# Patient Record
Sex: Female | Born: 1960 | Race: White | Hispanic: No | Smoking: Never smoker
Health system: Southern US, Community
[De-identification: ages and names within clinical notes are randomized; demographics above are authoritative.]

## PROBLEM LIST (undated history)

## (undated) DIAGNOSIS — A498 Other bacterial infections of unspecified site: Secondary | ICD-10-CM

## (undated) DIAGNOSIS — K5790 Diverticulosis of intestine, part unspecified, without perforation or abscess without bleeding: Secondary | ICD-10-CM

## (undated) DIAGNOSIS — K529 Noninfective gastroenteritis and colitis, unspecified: Secondary | ICD-10-CM

## (undated) DIAGNOSIS — I10 Essential (primary) hypertension: Secondary | ICD-10-CM

## (undated) HISTORY — PX: ABDOMINAL HYSTERECTOMY: SHX81

## (undated) HISTORY — DX: Other bacterial infections of unspecified site: A49.8

---

## 2006-11-10 ENCOUNTER — Emergency Department: Payer: Self-pay | Admitting: Internal Medicine

## 2011-12-19 ENCOUNTER — Emergency Department (HOSPITAL_BASED_OUTPATIENT_CLINIC_OR_DEPARTMENT_OTHER)
Admission: EM | Admit: 2011-12-19 | Discharge: 2011-12-19 | Disposition: A | Discharge status: Home / Self Care | Payer: BC Managed Care – PPO | Attending: Emergency Medicine | Admitting: Emergency Medicine

## 2011-12-19 ENCOUNTER — Encounter (HOSPITAL_BASED_OUTPATIENT_CLINIC_OR_DEPARTMENT_OTHER): Payer: Self-pay | Admitting: *Deleted

## 2011-12-19 DIAGNOSIS — R109 Unspecified abdominal pain: Secondary | ICD-10-CM

## 2011-12-19 DIAGNOSIS — I10 Essential (primary) hypertension: Secondary | ICD-10-CM | POA: Insufficient documentation

## 2011-12-19 DIAGNOSIS — R197 Diarrhea, unspecified: Secondary | ICD-10-CM | POA: Insufficient documentation

## 2011-12-19 HISTORY — DX: Essential (primary) hypertension: I10

## 2011-12-19 LAB — URINALYSIS, ROUTINE W REFLEX MICROSCOPIC
Hgb urine dipstick: NEGATIVE
Nitrite: NEGATIVE
Protein, ur: NEGATIVE mg/dL
Specific Gravity, Urine: 1.027 (ref 1.005–1.030)
Urobilinogen, UA: 0.2 mg/dL (ref 0.0–1.0)

## 2011-12-19 MED ORDER — DICYCLOMINE HCL 20 MG PO TABS: 20.0000 mg | ORAL_TABLET | Freq: Two times a day (BID) | ORAL | Status: DC

## 2011-12-19 MED ORDER — SODIUM CHLORIDE 0.9 % IV SOLN: Freq: Once | INTRAVENOUS | Status: DC

## 2011-12-19 MED ORDER — OXYCODONE-ACETAMINOPHEN 5-325 MG PO TABS
2.0000 | ORAL_TABLET | Freq: Once | ORAL | Status: AC
  Administered 2011-12-19: 2 via ORAL
  Filled 2011-12-19: qty 2

## 2011-12-19 NOTE — ED Provider Notes (Signed)
History     CSN: [id number]  Arrival date & time 12/19/11  1611   First MD Initiated Contact with Patient 12/19/11 1649      Chief Complaint  Patient presents with  . Abdominal Pain    (Consider location/radiation/quality/duration/timing/severity/associated sxs/prior treatment) Patient is a 51 y.o. female presenting with abdominal pain. The history is provided by the patient. No language interpreter was used.  Abdominal Pain The primary symptoms of the illness include abdominal pain and diarrhea. The primary symptoms of the illness do not include nausea or vomiting. The onset of the illness was gradual. The problem has been rapidly worsening.  The patient states that she believes she is currently not pregnant. Significant associated medical issues include inflammatory bowel disease.  Pt reports she has been diagnosed with colitis.  Pt has seen Dr. Norma Fredrickson and Dr. Alberteen Sam.   Pt is not currently on any medications.   Pt reports she has had diarrhea today and abdominal cramping  Past Medical History  Diagnosis Date  . Hypertension     Past Surgical History  Procedure Date  . Abdominal hysterectomy     History reviewed. No pertinent family history.  History  Substance Use Topics  . Smoking status: Never Smoker   . Smokeless tobacco: Not on file  . Alcohol Use: No    OB History    Grav Para Term Preterm Abortions TAB SAB Ect Mult Living                  Review of Systems  Gastrointestinal: Positive for abdominal pain and diarrhea. Negative for nausea and vomiting.  All other systems reviewed and are negative.    Allergies  Review of patient's allergies indicates no known allergies.  Home Medications   Current Outpatient Rx  Name Route Sig Dispense Refill  . PROPRANOLOL HCL 20 MG PO TABS Oral Take 20 mg by mouth 3 (three) times daily.      BP 146/95  Pulse 95  Temp(Src) 99.1 F (37.3 C) (Oral)  Resp 16  Ht 5\' 5"  (1.651 m)  Wt 166 lb (75.297 kg)  BMI 27.62  kg/m2  SpO2 100%  Physical Exam  Nursing note and vitals reviewed. Constitutional: She is oriented to person, place, and time. She appears well-developed and well-nourished.  HENT:  Head: Normocephalic and atraumatic.  Right Ear: External ear normal.  Left Ear: External ear normal.  Nose: Nose normal.  Mouth/Throat: Oropharynx is clear and moist.  Eyes: Conjunctivae and EOM are normal. Pupils are equal, round, and reactive to light.  Neck: Normal range of motion. Neck supple.  Cardiovascular: Normal rate, regular rhythm and normal heart sounds.   Pulmonary/Chest: Effort normal and breath sounds normal.  Abdominal: Soft.  Musculoskeletal: Normal range of motion.  Neurological: She is alert and oriented to person, place, and time. She has normal reflexes.  Skin: Skin is warm.  Psychiatric: She has a normal mood and affect.    ED Course  Procedures (including critical care time)   Labs Reviewed  URINALYSIS, ROUTINE W REFLEX MICROSCOPIC   No results found.   No diagnosis found.    MDM   Pt declines xrays or labs.  Pt request medication for cramps.    Pt reports she is scheduled to see her MD and have labs.     Dr. Anitra Lauth in to see and examine.  Pt given rx for bentyl      Lonia Skinner [place], [place] 12/19/11 1739

## 2011-12-19 NOTE — Discharge Instructions (Signed)

## 2011-12-19 NOTE — ED Notes (Signed)
Pt refused IV fluids and labs.

## 2011-12-19 NOTE — ED Provider Notes (Signed)
Medical screening examination/treatment/procedure(s) were conducted as a shared visit with non-physician practitioner(s) and myself.  I personally evaluated the patient during the encounter Patient with known colitis and intermittent abdominal pain that is usually worse with bowel movements. The pain that she really bad today and it did not resolve which led her to coming here. She has lab work scheduled for tomorrow and a colonoscopy in the near future. Her abdomen is soft and there are no peritoneal signs. She is refusing any imaging or lab tests and just wants some medicine for pain. Feel this is reasonable as patient has close followup  Gwyneth Sprout, MD 12/19/11 [phone]

## 2011-12-19 NOTE — ED Notes (Signed)
Pt c/o mid abd pain with loose stools x 1 day HX colitis

## 2011-12-19 NOTE — ED Notes (Signed)
Pt states does not want IV or lab work done. Will have lab work done by PMD. Lavonda Jumbo PA notified.

## 2012-06-01 ENCOUNTER — Ambulatory Visit (HOSPITAL_BASED_OUTPATIENT_CLINIC_OR_DEPARTMENT_OTHER)
Admission: RE | Admit: 2012-06-01 | Discharge: 2012-06-01 | Disposition: A | Discharge status: Home / Self Care | Payer: BC Managed Care – PPO | Source: Ambulatory Visit | Attending: Family Medicine | Admitting: Family Medicine

## 2012-06-01 ENCOUNTER — Other Ambulatory Visit (HOSPITAL_BASED_OUTPATIENT_CLINIC_OR_DEPARTMENT_OTHER): Payer: Self-pay | Admitting: Family Medicine

## 2012-06-01 DIAGNOSIS — Z1231 Encounter for screening mammogram for malignant neoplasm of breast: Secondary | ICD-10-CM | POA: Insufficient documentation

## 2013-06-26 ENCOUNTER — Emergency Department (HOSPITAL_BASED_OUTPATIENT_CLINIC_OR_DEPARTMENT_OTHER)
Admission: EM | Admit: 2013-06-26 | Discharge: 2013-06-26 | Disposition: A | Discharge status: Home / Self Care | Payer: BC Managed Care – PPO | Attending: Emergency Medicine | Admitting: Emergency Medicine

## 2013-06-26 ENCOUNTER — Encounter (HOSPITAL_BASED_OUTPATIENT_CLINIC_OR_DEPARTMENT_OTHER): Payer: Self-pay | Admitting: Emergency Medicine

## 2013-06-26 ENCOUNTER — Emergency Department (HOSPITAL_BASED_OUTPATIENT_CLINIC_OR_DEPARTMENT_OTHER): Payer: BC Managed Care – PPO

## 2013-06-26 DIAGNOSIS — Z79899 Other long term (current) drug therapy: Secondary | ICD-10-CM | POA: Insufficient documentation

## 2013-06-26 DIAGNOSIS — K5730 Diverticulosis of large intestine without perforation or abscess without bleeding: Secondary | ICD-10-CM | POA: Insufficient documentation

## 2013-06-26 DIAGNOSIS — I10 Essential (primary) hypertension: Secondary | ICD-10-CM | POA: Insufficient documentation

## 2013-06-26 DIAGNOSIS — Z90710 Acquired absence of both cervix and uterus: Secondary | ICD-10-CM | POA: Insufficient documentation

## 2013-06-26 DIAGNOSIS — Z792 Long term (current) use of antibiotics: Secondary | ICD-10-CM | POA: Insufficient documentation

## 2013-06-26 DIAGNOSIS — R109 Unspecified abdominal pain: Secondary | ICD-10-CM

## 2013-06-26 HISTORY — DX: Noninfective gastroenteritis and colitis, unspecified: K52.9

## 2013-06-26 HISTORY — DX: Diverticulosis of intestine, part unspecified, without perforation or abscess without bleeding: K57.90

## 2013-06-26 LAB — CBC WITH DIFFERENTIAL/PLATELET
Basophils Absolute: 0 10*3/uL (ref 0.0–0.1)
Basophils Relative: 0 % (ref 0–1)
Eosinophils Relative: 1 % (ref 0–5)
HCT: 42.2 % (ref 36.0–46.0)
MCHC: 33.4 g/dL (ref 30.0–36.0)
MCV: 90.6 fL (ref 78.0–100.0)
Monocytes Absolute: 1.2 10*3/uL — ABNORMAL HIGH (ref 0.1–1.0)
Neutro Abs: 10.4 10*3/uL — ABNORMAL HIGH (ref 1.7–7.7)
Platelets: 269 10*3/uL (ref 150–400)
RDW: 15 % (ref 11.5–15.5)
WBC: 13.4 10*3/uL — ABNORMAL HIGH (ref 4.0–10.5)

## 2013-06-26 LAB — COMPREHENSIVE METABOLIC PANEL
ALT: 18 U/L (ref 0–35)
AST: 19 U/L (ref 0–37)
Albumin: 4 g/dL (ref 3.5–5.2)
Calcium: 9.4 mg/dL (ref 8.4–10.5)
Creatinine, Ser: 0.7 mg/dL (ref 0.50–1.10)
Sodium: 138 mEq/L (ref 135–145)

## 2013-06-26 LAB — URINALYSIS, ROUTINE W REFLEX MICROSCOPIC
Glucose, UA: NEGATIVE mg/dL
Ketones, ur: 15 mg/dL — AB
Leukocytes, UA: NEGATIVE
Protein, ur: NEGATIVE mg/dL
Urobilinogen, UA: 0.2 mg/dL (ref 0.0–1.0)

## 2013-06-26 MED ORDER — METRONIDAZOLE 500 MG PO TABS: 500.0000 mg | ORAL_TABLET | Freq: Two times a day (BID) | ORAL | Status: DC

## 2013-06-26 MED ORDER — TRAMADOL HCL 50 MG PO TABS: 50.0000 mg | ORAL_TABLET | Freq: Four times a day (QID) | ORAL | Status: DC | PRN

## 2013-06-26 MED ORDER — CIPROFLOXACIN HCL 500 MG PO TABS: 500.0000 mg | ORAL_TABLET | Freq: Two times a day (BID) | ORAL | Status: DC

## 2013-06-26 NOTE — ED Provider Notes (Signed)
CSN: [id number]     Arrival date & time 06/26/13  1041 History   First MD Initiated Contact with Patient 06/26/13 1105     Chief Complaint  Patient presents with  . Dysuria   (Consider location/radiation/quality/duration/timing/severity/associated sxs/prior Treatment) HPI Comments: Patient presents with lower abdominal pain. She does have a history of diverticulosis and colitis and states she had similar pain currently. She has pain to her lower abdomen. Constant for the last few days. She's had a little bit of urinary symptoms with burning on urination. She denies any back pain. She denies any nausea vomiting or diarrhea. She's not had a bowel movement last 2-3 days. She denies he fevers or chills. She denies any vaginal bleeding or discharge.  Patient is a 52 y.o. female presenting with dysuria.  Dysuria Associated symptoms: abdominal pain   Associated symptoms: no fever, no flank pain, no nausea and no vomiting     Past Medical History  Diagnosis Date  . Hypertension   . Diverticulosis   . Colitis    Past Surgical History  Procedure Laterality Date  . Abdominal hysterectomy     No family history on file. History  Substance Use Topics  . Smoking status: Never Smoker   . Smokeless tobacco: Not on file  . Alcohol Use: No   OB History   Grav Para Term Preterm Abortions TAB SAB Ect Mult Living                 Review of Systems  Constitutional: Negative for fever, chills, diaphoresis and fatigue.  HENT: Negative for congestion, rhinorrhea and sneezing.   Eyes: Negative.   Respiratory: Negative for cough, chest tightness and shortness of breath.   Cardiovascular: Negative for chest pain and leg swelling.  Gastrointestinal: Positive for abdominal pain. Negative for nausea, vomiting, diarrhea and blood in stool.  Genitourinary: Positive for dysuria. Negative for frequency, hematuria, flank pain and difficulty urinating.  Musculoskeletal: Negative for arthralgias and back pain.   Skin: Negative for rash.  Neurological: Negative for dizziness, speech difficulty, weakness, numbness and headaches.    Allergies  Review of patient's allergies indicates no known allergies.  Home Medications   Current Outpatient Rx  Name  Route  Sig  Dispense  Refill  . ciprofloxacin (CIPRO) 500 MG tablet   Oral   Take 1 tablet (500 mg total) by mouth 2 (two) times daily. One po bid x 7 days   14 tablet   0   . metroNIDAZOLE (FLAGYL) 500 MG tablet   Oral   Take 1 tablet (500 mg total) by mouth 2 (two) times daily. One po bid x 7 days   14 tablet   0   . propranolol (INDERAL) 20 MG tablet   Oral   Take 20 mg by mouth 3 (three) times daily.         . traMADol (ULTRAM) 50 MG tablet   Oral   Take 1 tablet (50 mg total) by mouth every 6 (six) hours as needed.   15 tablet   0    BP 148/98  Pulse 105  Temp(Src) 98.9 F (37.2 C)  Resp 16  Ht 5\' 5"  (1.651 m)  Wt 165 lb (74.844 kg)  BMI 27.46 kg/m2  SpO2 98% Physical Exam  Constitutional: She is oriented to person, place, and time. She appears well-developed and well-nourished.  HENT:  Head: Normocephalic and atraumatic.  Eyes: Pupils are equal, round, and reactive to light.  Neck: Normal range of motion.  Neck supple.  Cardiovascular: Normal rate, regular rhythm and normal heart sounds.   Pulmonary/Chest: Effort normal and breath sounds normal. No respiratory distress. She has no wheezes. She has no rales. She exhibits no tenderness.  Abdominal: Soft. Bowel sounds are normal. There is tenderness (mild tenderness across the lower abdomen and in the left midabdomen). There is no rebound and no guarding.  Musculoskeletal: Normal range of motion. She exhibits no edema.  Lymphadenopathy:    She has no cervical adenopathy.  Neurological: She is alert and oriented to person, place, and time.  Skin: Skin is warm and dry. No rash noted.  Psychiatric: She has a normal mood and affect.    ED Course  Procedures (including  critical care time) Labs Review Results for orders placed during the hospital encounter of 06/26/13  URINALYSIS, ROUTINE W REFLEX MICROSCOPIC      Result Value Range   Color, Urine YELLOW  YELLOW   APPearance CLEAR  CLEAR   Specific Gravity, Urine 1.023  1.005 - 1.030   pH 6.5  5.0 - 8.0   Glucose, UA NEGATIVE  NEGATIVE mg/dL   Hgb urine dipstick NEGATIVE  NEGATIVE   Bilirubin Urine NEGATIVE  NEGATIVE   Ketones, ur 15 (*) NEGATIVE mg/dL   Protein, ur NEGATIVE  NEGATIVE mg/dL   Urobilinogen, UA 0.2  0.0 - 1.0 mg/dL   Nitrite NEGATIVE  NEGATIVE   Leukocytes, UA NEGATIVE  NEGATIVE  CBC WITH DIFFERENTIAL      Result Value Range   WBC 13.4 (*) 4.0 - 10.5 K/uL   RBC 4.66  3.87 - 5.11 MIL/uL   Hemoglobin 14.1  12.0 - 15.0 g/dL   HCT 21.3  08.6 - 57.8 %   MCV 90.6  78.0 - 100.0 fL   MCH 30.3  26.0 - 34.0 pg   MCHC 33.4  30.0 - 36.0 g/dL   RDW 46.9  62.9 - 52.8 %   Platelets 269  150 - 400 K/uL   Neutrophils Relative % 78 (*) 43 - 77 %   Neutro Abs 10.4 (*) 1.7 - 7.7 K/uL   Lymphocytes Relative 12  12 - 46 %   Lymphs Abs 1.6  0.7 - 4.0 K/uL   Monocytes Relative 9  3 - 12 %   Monocytes Absolute 1.2 (*) 0.1 - 1.0 K/uL   Eosinophils Relative 1  0 - 5 %   Eosinophils Absolute 0.2  0.0 - 0.7 K/uL   Basophils Relative 0  0 - 1 %   Basophils Absolute 0.0  0.0 - 0.1 K/uL  COMPREHENSIVE METABOLIC PANEL      Result Value Range   Sodium 138  135 - 145 mEq/L   Potassium 4.0  3.5 - 5.1 mEq/L   Chloride 101  96 - 112 mEq/L   CO2 25  19 - 32 mEq/L   Glucose, Bld 118 (*) 70 - 99 mg/dL   BUN 13  6 - 23 mg/dL   Creatinine, Ser 4.13  0.50 - 1.10 mg/dL   Calcium 9.4  8.4 - 24.4 mg/dL   Total Protein 7.5  6.0 - 8.3 g/dL   Albumin 4.0  3.5 - 5.2 g/dL   AST 19  0 - 37 U/L   ALT 18  0 - 35 U/L   Alkaline Phosphatase 89  39 - 117 U/L   Total Bilirubin 0.4  0.3 - 1.2 mg/dL   GFR calc non Af Amer >90  >90 mL/min   GFR calc Af Amer >  90  >90 mL/min   Dg Abd Acute W/chest  06/26/2013   CLINICAL  DATA:  Patient has not been able to urinate for 2 days  EXAM: ACUTE ABDOMEN SERIES (ABDOMEN 2 VIEW & CHEST 1 VIEW)  COMPARISON:  None  FINDINGS: There is no evidence of dilated bowel loops or free intraperitoneal air. No radiopaque calculi or other significant radiographic abnormality is seen. Heart size and mediastinal contours are within normal limits. Both lungs are clear.  IMPRESSION: Negative abdominal radiographs.  No acute cardiopulmonary disease.   Electronically Signed   By: Signa Kell M.D.   On: 06/26/2013 11:44    Imaging Review Dg Abd Acute W/chest  06/26/2013   CLINICAL DATA:  Patient has not been able to urinate for 2 days  EXAM: ACUTE ABDOMEN SERIES (ABDOMEN 2 VIEW & CHEST 1 VIEW)  COMPARISON:  None  FINDINGS: There is no evidence of dilated bowel loops or free intraperitoneal air. No radiopaque calculi or other significant radiographic abnormality is seen. Heart size and mediastinal contours are within normal limits. Both lungs are clear.  IMPRESSION: Negative abdominal radiographs.  No acute cardiopulmonary disease.   Electronically Signed   By: Signa Kell M.D.   On: 06/26/2013 11:44    EKG Interpretation   None       MDM   1. Abdominal  pain, other specified site    Patient presents with left lower quadrant and suprapubic abdominal pain. She states her pain is similar to her past episodes of colitis/diverticulitis. She has no fevers or vomiting. She's otherwise well-appearing. Her labs are unremarkable other than a mildly elevated white count. I did discuss with her doing a CT scan of her abdomen but she did not want to do this at this point. I feel it's reasonable to give her a trial of Cipro and Flagyl. She will come back if her symptoms worsen and I advised her followup with her primary care physician if her symptoms are not improving within next 2-3 days.    Rolan Bucco, MD 06/26/13 1332

## 2013-06-26 NOTE — ED Notes (Signed)
Dysuria for a few days.  No fever.

## 2013-08-10 ENCOUNTER — Encounter: Payer: Self-pay | Admitting: Family Medicine

## 2013-08-10 ENCOUNTER — Ambulatory Visit (INDEPENDENT_AMBULATORY_CARE_PROVIDER_SITE_OTHER): Payer: BC Managed Care – PPO | Admitting: Family Medicine

## 2013-08-10 VITALS — BP 132/86 | HR 71 | Resp 16 | Ht 65.0 in | Wt 188.0 lb

## 2013-08-10 DIAGNOSIS — K529 Noninfective gastroenteritis and colitis, unspecified: Secondary | ICD-10-CM

## 2013-08-10 DIAGNOSIS — K5289 Other specified noninfective gastroenteritis and colitis: Secondary | ICD-10-CM

## 2013-08-10 MED ORDER — DICYCLOMINE HCL 20 MG PO TABS: 20.0000 mg | ORAL_TABLET | Freq: Three times a day (TID) | ORAL | Status: AC

## 2013-08-10 NOTE — Progress Notes (Signed)
Subjective:    Patient ID: Melissa Everett, female    DOB: 08/31/1960, 53 y.o.   MRN: [id number]  HPI  Melissa Everett is here today to discuss her colitis.  She has struggled with this problem for several years and has had significant worsening of her symptoms since November.  She was seen in the ED downstairs in early December and was put on Cipro.  She says that she really did not notice any improvement but she admits that she really did not take it as she was supposed to.  They did an x-ray which was normal.  She is very frustrated because she really does not know why she has so many problems. She has had a couple of colonoscopies over the years and all that she is told is that she has colitis.  She has never been told that she has Crohn's or Ulcerative Colitis.  She has also only been given antibiotics occasionally which she feels gives her only short-term relief.  She says that it feels that her intestines are irritated.   She is very concerned because her stools are looking very different. She says that they are full of mucus which is different from how they have been in the past.  She has a lot of cramping and feels miserable.  She has been seen by a couple of gastroenterologists in our area and would like another opinion.     Review of Systems  Constitutional: Negative for fever, chills, fatigue and unexpected weight change.  Gastrointestinal: Positive for abdominal pain, constipation, blood in stool and rectal pain.       Anal mucous discharge and cramping  All other systems reviewed and are negative.    Past Medical History  Diagnosis Date  . Hypertension   . Diverticulosis   . Colitis      Past Surgical History  Procedure Laterality Date  . Abdominal hysterectomy       History   Social History Narrative   Marital Status: Widowed   Children:  Son Development worker, international aid(Melissa Everett)    Pets: Cats (2)    Living Situation: Lives alone.    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 Hyperlipidemia Mother   . Diabetes Mother   . Colitis Brother   . Colitis Paternal Aunt   . Diabetes Maternal Grandmother   . Heart attack Maternal Grandfather   . Cancer Paternal Grandmother     Lung Cancer  . Heart attack Paternal Grandfather      Current Outpatient Prescriptions on File Prior to Visit  Medication Sig Dispense Refill  . propranolol (INDERAL) 20 MG tablet Take 20 mg by mouth 3 (three) times daily.       No current facility-administered medications on file prior to visit.     No Known Allergies     Objective:   Physical Exam  Constitutional: Vital signs are normal. She appears well-developed and well-nourished. She is cooperative. She has a sickly appearance (She doesn't feel well and is frustrated.  ).  HENT:  Head: Normocephalic.  Eyes: No scleral icterus.  Neck: Neck supple. No thyromegaly present.  Cardiovascular: Normal rate and  regular rhythm.   Pulmonary/Chest: Effort normal and breath sounds normal.  Abdominal: Soft. Bowel sounds are normal. She exhibits no distension and no mass. There is tenderness (Generalized ). There is no rebound and no guarding.  Neurological: She is alert.      Assessment & Plan:    Gwendy was seen today for colitis.  Diagnoses and associated orders for this visit:  Colitis Comments: We had a long discussion about how we are going to get an answer for her.  I recommended that she see Dr. Bernita Buffy at [facility] if she is willing to drive that far. She is very frustrated and would like some kind of answer as to what she has and have someone give her some type of plan to do when she has these flares.  I debated about starting her on a medication for Crohn's like one of the aminosalicylates (e.g. Mesalamine)  but decided that I should probably wait for Dr. Penne Lash to see her.  From [facility] description, it sounds like her intestines  feel very inflamed.  I did give her Bentyl for the cramping.  I told [name] that she will need to go by [facility] and get copies of the scopes that she has had.    - dicyclomine (BENTYL) 20 MG tablet; Take 1 tablet (20 mg total) by mouth 3 (three) times daily before meals.  TIME SPENT "FACE TO FACE" WITH PATIENT -  30 MINS

## 2013-08-16 ENCOUNTER — Telehealth: Payer: Self-pay | Admitting: *Deleted

## 2013-08-16 NOTE — Telephone Encounter (Signed)
Pt called inquiring about spec. appointment. She is pretty sick and was wanting to check status.

## 2013-08-17 DIAGNOSIS — K529 Noninfective gastroenteritis and colitis, unspecified: Secondary | ICD-10-CM | POA: Insufficient documentation

## 2013-08-17 NOTE — Telephone Encounter (Signed)
I have the form for [facility] done.  Im waiting on Dr Camie PatienceZanard's note to fax it.  They will contact her once they receive the notes. PG

## 2013-08-18 ENCOUNTER — Other Ambulatory Visit: Payer: Self-pay | Admitting: Family Medicine

## 2013-08-18 DIAGNOSIS — R195 Other fecal abnormalities: Secondary | ICD-10-CM

## 2013-08-18 DIAGNOSIS — K529 Noninfective gastroenteritis and colitis, unspecified: Secondary | ICD-10-CM

## 2013-08-18 DIAGNOSIS — R197 Diarrhea, unspecified: Secondary | ICD-10-CM

## 2013-08-18 MED ORDER — METRONIDAZOLE 500 MG PO TABS: 500.0000 mg | ORAL_TABLET | Freq: Two times a day (BID) | ORAL | Status: DC

## 2013-08-18 MED ORDER — CIPROFLOXACIN HCL 500 MG PO TABS: 500.0000 mg | ORAL_TABLET | Freq: Two times a day (BID) | ORAL | Status: AC

## 2013-08-23 ENCOUNTER — Other Ambulatory Visit: Payer: Self-pay | Admitting: *Deleted

## 2013-08-23 DIAGNOSIS — R195 Other fecal abnormalities: Secondary | ICD-10-CM

## 2013-08-23 DIAGNOSIS — R197 Diarrhea, unspecified: Secondary | ICD-10-CM

## 2013-08-24 ENCOUNTER — Telehealth: Payer: Self-pay | Admitting: *Deleted

## 2013-08-24 ENCOUNTER — Other Ambulatory Visit: Payer: Self-pay | Admitting: Family Medicine

## 2013-08-24 DIAGNOSIS — K529 Noninfective gastroenteritis and colitis, unspecified: Secondary | ICD-10-CM

## 2013-08-24 LAB — OVA AND PARASITE SCREEN: OP: NONE SEEN

## 2013-08-24 LAB — CLOSTRIDIUM DIFFICILE EIA: CDIFTX: POSITIVE

## 2013-08-24 MED ORDER — METRONIDAZOLE 500 MG PO TABS: 500.0000 mg | ORAL_TABLET | Freq: Three times a day (TID) | ORAL | Status: AC

## 2013-08-24 NOTE — Telephone Encounter (Signed)
Melissa Everett is aware that her stool is positive for C. Diff.  She was educated on contact precautions.  She also was instructed to take her Flagyl as prescribed.  She will contact our office if she has any questions.  PG

## 2013-08-24 NOTE — Telephone Encounter (Signed)
[name] tested positive for C. diff so we are treating with Flagyl TID for 14 days.  We'll recheck her for C diff a week after she completes the antibiotic.  She was instructed to take Align after she completes the Flagyl and is eat yogurt twice a day while she is on the Flagyl.

## 2013-08-25 LAB — STOOL CELLS, WBC & RBC
RBC/40X FIELD:: 0
WBC/40X FIELD:: 0

## 2013-08-27 LAB — STOOL CULTURE

## 2013-09-06 ENCOUNTER — Encounter: Payer: Self-pay | Admitting: Family Medicine

## 2013-09-06 ENCOUNTER — Ambulatory Visit (INDEPENDENT_AMBULATORY_CARE_PROVIDER_SITE_OTHER): Payer: BC Managed Care – PPO | Admitting: Family Medicine

## 2013-09-06 VITALS — BP 138/97 | HR 71 | Resp 16 | Wt 188.0 lb

## 2013-09-06 DIAGNOSIS — B3782 Candidal enteritis: Secondary | ICD-10-CM

## 2013-09-06 DIAGNOSIS — A0472 Enterocolitis due to Clostridium difficile, not specified as recurrent: Secondary | ICD-10-CM

## 2013-09-06 MED ORDER — FLUCONAZOLE 200 MG PO TABS: 200.0000 mg | ORAL_TABLET | Freq: Every day | ORAL | Status: DC

## 2013-09-06 NOTE — Progress Notes (Signed)
   Subjective:    Patient ID: Melissa Everett, female    DOB: 05/19/1961, 53 y.o.   MRN: [id number]  HPI  Melissa Everett is back to discuss her intestinal infection (C. diff).  She has completed one week of Flagyl and feels much better.  The mucus in her stools has resolved.  [facility] is [facility].  She would like to have redo her tests after she completes her therapy to be sure that she has cleared the C. diff bacteria.      Review of Systems  Gastrointestinal: Negative for abdominal pain and diarrhea.       Resolved   All other systems reviewed and are negative.     Past Medical History  Diagnosis Date  . Hypertension   . Diverticulosis   . Colitis      Past Surgical History  Procedure Laterality Date  . Abdominal hysterectomy       History   Social History Narrative   Marital Status: Widowed   Children:  Son Development worker, international aid(Dean)    Pets: Cats (2)    Living Situation: Lives alone.    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 Hyperlipidemia Mother   . Diabetes Mother   . Colitis Brother   . Colitis Paternal Aunt   . Diabetes Maternal Grandmother   . Heart attack Maternal Grandfather   . Cancer Paternal Grandmother     Lung Cancer  . Heart attack Paternal Grandfather      Current Outpatient Prescriptions on File Prior to Visit  Medication Sig Dispense Refill  . metroNIDAZOLE (FLAGYL) 500 MG tablet Take 1 tablet (500 mg total) by mouth 3 (three) times daily.  42 tablet  0  . propranolol (INDERAL) 20 MG tablet Take 20 mg by mouth 3 (three) times daily.      Marland Kitchen. dicyclomine (BENTYL) 20 MG tablet Take 1 tablet (20 mg total) by mouth 3 (three) times daily before meals.  60 tablet  1   No current facility-administered  medications on file prior to visit.     No Known Allergies      Objective:   Physical Exam  Constitutional: She appears well-developed and well-nourished. No distress.  Abdominal: Soft. There is no tenderness.      Assessment & Plan:    Melissa Everett was seen today for abdominal pain.  Diagnoses and associated orders for this visit:  Candidiasis of the intestine Comments: She is to complete her Flagyl therapy then she will start on the Diflucan for 7 days.  She will also remain on a probiotic.   - fluconazole (DIFLUCAN) 200 MG tablet; Take 1 tablet (200 mg total) by mouth daily.  C. difficile colitis Comments: She is feeling much better.  We'll recheck her stool once she completes her treatment.

## 2013-09-06 NOTE — Patient Instructions (Signed)
1)  C Diff - Finish the Flagyl.    2)  Yeast - After you finish the Flagyl then take the Diflucan for 7 days.     Clostridium Difficile Infection Clostridium difficile (C. difficile) is a bacteria found in the intestinal tract or colon. Under certain conditions, it causes diarrhea and sometimes severe disease. The severe form of the disease is known as pseudomembranous colitis (often called C. difficile colitis). This disease can damage the lining of the colon or cause the colon to become enlarged (toxic megacolon).  CAUSES  Your colon normally contains many different bacteria, including C. difficile. The balance of bacteria in your colon can change during illness. This is especially true when you take antibiotic medicine. Taking antibiotics may allow the C. difficile to grow, multiply excessively, and make a toxin that then causes illness. The elderly and people with certain medical conditions have a greater risk of getting C. difficile infections. SYMPTOMS   Watery diarrhea.  Fever.  Fatigue.  Loss of appetite.  Nausea.  Abdominal swelling, pain, or tenderness.  Dehydration. DIAGNOSIS  Your symptoms may make your caregiver suspicious of a C. difficile infection, especially if you have used antibiotics in the preceding weeks. However, there are only 2 ways to know for certain whether you have a C. difficile infection:  A lab test that finds the toxin in your stool.  The specific appearance of an abnormality (pseudomembrane) in your colon. This can only be seen by doing a sigmoidoscopy or colonoscopy. These procedures involve passing an instrument through your rectum to look at the inside of your colon. Your caregiver will help determine if these tests are necessary. TREATMENT   Most people are successfully treated with one of two specific antibiotics, usually given by mouth. Other antibiotics you are receiving are stopped if possible.  Intravenous (IV) fluids and correction of  electrolyte imbalance may be necessary.  Rarely, surgery may be needed to remove the infected part of the intestines.  Careful hand washing by you and your caregivers is important to prevent the spread of infection. In the hospital, your caregivers may also put on gowns and gloves to prevent the spread of the C. difficile bacteria. Your room is also cleaned regularly with a solution containing bleach or a product that is known to kill C. difficile. HOME CARE INSTRUCTIONS  Drink enough fluids to keep your urine clear or pale yellow. Avoid milk, caffeine, and alcohol.  Ask your caregiver for specific rehydration instructions.  Try eating small, frequent meals rather than large meals.  Take your antibiotics as directed. Finish them even if you start to feel better.  Do not use medicines to slow diarrhea. This could delay healing or cause complications.  Wash your hands thoroughly after using the bathroom and before preparing food.  Make sure people who live with you wash their hands often, too.  Carefully disinfect all surfaces with a product that contains chlorine bleach. SEEK MEDICAL CARE IF:  Diarrhea persists longer than expected or recurs after completing your course of antibiotic treatment for the C. difficile infection.  You have trouble staying hydrated. SEEK IMMEDIATE MEDICAL CARE IF:  You develop a new fever.  You have increasing abdominal pain or tenderness.  There is blood in your stools, or your stools are dark black and tarry.  You cannot hold down food or liquids. MAKE SURE YOU:   Understand these instructions.  Will watch your condition.  Will get help right away if you are not doing  well or get worse. Document Released: 04/17/2005 Document Revised: 11/02/2012 Document Reviewed: 12/14/2010 [facility] Patient Information 2014 [place], [place].

## 2013-09-17 ENCOUNTER — Encounter (INDEPENDENT_AMBULATORY_CARE_PROVIDER_SITE_OTHER): Payer: BC Managed Care – PPO | Admitting: Family Medicine

## 2013-09-17 VITALS — BP 126/89 | HR 83 | Resp 16 | Wt 190.0 lb

## 2013-09-17 NOTE — Progress Notes (Signed)
This encounter was created in error - please disregard.

## 2013-09-20 LAB — OVA AND PARASITE EXAMINATION: OP: NONE SEEN

## 2013-09-21 ENCOUNTER — Other Ambulatory Visit: Payer: Self-pay | Admitting: *Deleted

## 2013-09-21 MED ORDER — PROPRANOLOL HCL 20 MG PO TABS: 20.0000 mg | ORAL_TABLET | Freq: Three times a day (TID) | ORAL | Status: DC

## 2013-09-23 LAB — CLOSTRIDIUM DIFFICILE CULTURE-FECAL

## 2013-09-24 LAB — CLOSTRIDIUM DIFFICILE TOXIN: C diff Toxin A: DETECTED — AB

## 2013-09-27 ENCOUNTER — Ambulatory Visit: Payer: BC Managed Care – PPO | Admitting: Family Medicine

## 2013-09-29 ENCOUNTER — Other Ambulatory Visit: Payer: Self-pay | Admitting: Family Medicine

## 2013-09-29 ENCOUNTER — Encounter: Payer: Self-pay | Admitting: Family Medicine

## 2013-09-29 ENCOUNTER — Ambulatory Visit (INDEPENDENT_AMBULATORY_CARE_PROVIDER_SITE_OTHER): Payer: BC Managed Care – PPO | Admitting: Family Medicine

## 2013-09-29 VITALS — BP 132/90 | HR 85 | Resp 16 | Wt 195.0 lb

## 2013-09-29 DIAGNOSIS — R1013 Epigastric pain: Secondary | ICD-10-CM | POA: Insufficient documentation

## 2013-09-29 DIAGNOSIS — K219 Gastro-esophageal reflux disease without esophagitis: Secondary | ICD-10-CM

## 2013-09-29 DIAGNOSIS — A0472 Enterocolitis due to Clostridium difficile, not specified as recurrent: Secondary | ICD-10-CM

## 2013-09-29 DIAGNOSIS — K859 Acute pancreatitis without necrosis or infection, unspecified: Secondary | ICD-10-CM

## 2013-09-29 DIAGNOSIS — I10 Essential (primary) hypertension: Secondary | ICD-10-CM

## 2013-09-29 LAB — COMPLETE METABOLIC PANEL WITH GFR
ALT: 32 U/L (ref 0–35)
AST: 29 U/L (ref 0–37)
Albumin: 4.2 g/dL (ref 3.5–5.2)
Alkaline Phosphatase: 71 U/L (ref 39–117)
BUN: 11 mg/dL (ref 6–23)
CO2: 30 mEq/L (ref 19–32)
Calcium: 9.3 mg/dL (ref 8.4–10.5)
Chloride: 103 mEq/L (ref 96–112)
Creat: 0.76 mg/dL (ref 0.50–1.10)
GFR, Est African American: >89 mL/min
GFR, Est Non African American: >89 mL/min
Glucose, Bld: 89 mg/dL (ref 70–99)
Potassium: 4.5 mEq/L (ref 3.5–5.3)
Sodium: 141 mEq/L (ref 135–145)
Total Bilirubin: 0.4 mg/dL (ref 0.2–1.2)
Total Protein: 6.7 g/dL (ref 6.0–8.3)

## 2013-09-29 LAB — CBC WITH DIFFERENTIAL/PLATELET
Basophils Absolute: 0 10*3/uL (ref 0.0–0.1)
Basophils Relative: 0 % (ref 0–1)
Eosinophils Absolute: 0.3 10*3/uL (ref 0.0–0.7)
Eosinophils Relative: 4 % (ref 0–5)
HCT: 43.1 % (ref 36.0–46.0)
Hemoglobin: 14.1 g/dL (ref 12.0–15.0)
Lymphocytes Relative: 35 % (ref 12–46)
Lymphs Abs: 2.5 10*3/uL (ref 0.7–4.0)
MCH: 29.1 pg (ref 26.0–34.0)
MCHC: 32.7 g/dL (ref 30.0–36.0)
MCV: 89 fL (ref 78.0–100.0)
Monocytes Absolute: 0.4 10*3/uL (ref 0.1–1.0)
Monocytes Relative: 6 % (ref 3–12)
Neutro Abs: 3.9 10*3/uL (ref 1.7–7.7)
Neutrophils Relative %: 55 % (ref 43–77)
Platelets: 294 10*3/uL (ref 150–400)
RBC: 4.84 MIL/uL (ref 3.87–5.11)
RDW: 14.9 % (ref 11.5–15.5)
WBC: 7 10*3/uL (ref 4.0–10.5)

## 2013-09-29 LAB — LIPASE: Lipase: 105 U/L — ABNORMAL HIGH (ref 0–75)

## 2013-09-29 LAB — GAMMA GT: GGT: 32 U/L (ref 7–51)

## 2013-09-29 MED ORDER — PANTOPRAZOLE SODIUM 40 MG PO TBEC: 40.0000 mg | DELAYED_RELEASE_TABLET | Freq: Every day | ORAL | Status: DC

## 2013-09-29 MED ORDER — PROPRANOLOL HCL 20 MG PO TABS: 20.0000 mg | ORAL_TABLET | Freq: Two times a day (BID) | ORAL | Status: AC

## 2013-09-29 MED ORDER — VANCOMYCIN HCL 250 MG PO CAPS: 250.0000 mg | ORAL_CAPSULE | Freq: Four times a day (QID) | ORAL | Status: AC

## 2013-09-29 NOTE — Progress Notes (Signed)
Subjective:    Patient ID: Melissa Everett, female    DOB: 04/16/1961, 53 y.o.   MRN: [id number]  HPI  Melissa Everett is here today to discuss the conditions listed below:   1)  GI Problems - She continues to struggle with abdominal pain.  Her latest symptom is pain in her upper abdomen that radiates to her midback.  She also is having more bowel movements.  She is currently taking probiotics and is eating yogurt which seems to have given her minimal improvement.    2)  FMLA Paperwork - She needs to have her FMLA forms completed.     Review of Systems  Constitutional: Negative for fever, activity change, appetite change, fatigue and unexpected weight change.  HENT: Negative.   Eyes: Negative.   Respiratory: Negative for shortness of breath.   Cardiovascular: Negative for chest pain, palpitations and leg swelling.  Gastrointestinal: Positive for abdominal pain (Pain radiates to her back.  ). Negative for nausea, vomiting, diarrhea, constipation, blood in stool and rectal pain.       Upper abdomen  Endocrine: Negative.   Genitourinary: Negative for difficulty urinating.  Musculoskeletal: Positive for back pain.  Skin: Negative.   Neurological: Negative.   Hematological: Negative for adenopathy. Does not bruise/bleed easily.  Psychiatric/Behavioral: Negative for sleep disturbance and dysphoric mood. The patient is not nervous/anxious.      Past Medical History  Diagnosis Date  . Hypertension   . Diverticulosis   . Colitis   . Clostridium difficile infection      Past Surgical History  Procedure Laterality Date  . Abdominal hysterectomy       History   Social History Narrative   Marital Status: Widowed   Children:  Son Development worker, international aid(Melissa Everett)    Pets: Cats (2)    Living Situation: Lives alone.    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 Hyperlipidemia Mother   . Diabetes Mother   . Hypertension Mother   . Colitis Brother   . Colitis Paternal Aunt   . Diabetes Maternal Grandmother   . Heart attack Maternal Grandfather   . Cancer Paternal Grandmother     Lung   . Heart attack Paternal Grandfather   . ALS Father      Current Outpatient Prescriptions on File Prior to Visit  Medication Sig Dispense Refill  . dicyclomine (BENTYL) 20 MG tablet Take 1 tablet (20 mg total) by mouth 3 (three) times daily before meals.  60 tablet  1   No current facility-administered medications on file prior to visit.    No Known Allergies      Objective:   Physical Exam  Vitals reviewed. Constitutional: She is oriented to person, place, and time.  Eyes: Conjunctivae are normal. No scleral icterus.  Neck: Neck supple. No thyromegaly present.  Cardiovascular: Normal rate, regular rhythm and normal heart sounds.   Pulmonary/Chest: Effort normal and breath sounds normal.  Abdominal: Soft. She exhibits no distension and no mass. There is tenderness. There is no rebound and no guarding.  Musculoskeletal: She exhibits no edema and no tenderness.  Lymphadenopathy:    She has no cervical adenopathy.  Neurological: She is alert and oriented to person, place, and time.  Skin: Skin is warm and dry.  Psychiatric: She has a normal mood and affect. Her behavior is normal. Judgment and thought content normal.      Assessment & Plan:    Toi was seen today for gi problems.  Diagnoses and associated orders for this visit:  Enteritis due to Clostridium difficile Comments: Zalma completed her course of Flagyl which defintely improved her symptoms.  We rechecked her stool which showed that she has not yet cleared the C. diff.  I spoke with a gastroenterologist who recommended that we treat her with vancomycin.   - vancomycin (VANCOCIN) 250 MG  capsule; Take 1 capsule (250 mg total) by mouth 4 (four) times daily. Take for 2 weeks - CBC w/Diff  GERD (gastroesophageal reflux disease) Comments: She is to start on Protonix.  We are checking her for H. pylori.   - H. pylori antibody, IgG - pantoprazole (PROTONIX) 40 MG tablet; Take 1 tablet (40 mg total) by mouth daily.  Abdominal pain, epigastric   - Lipase - COMPLETE METABOLIC PANEL WITH GFR - Gamma GT - H. pylori antibody, IgG  Essential hypertension, benign Comments: Refilled her propranolol.   - propranolol (INDERAL) 20 MG tablet; Take 1 tablet (20 mg total) by mouth 2 (two) times daily.  Pancreatitis, acute Comments: Jara admitted to drinking a lot this weekend.  We checked a lipase which was elevated which explains the symptoms that she has been having.   TIME SPENT "FACE TO FACE" WITH PATIENT -  30 MINS

## 2013-09-29 NOTE — Patient Instructions (Signed)
1)  Abdominal Pain - Your lipase level is high suggesting that you have pancreatitis.  You need to limit your alcohol.     Acute Pancreatitis Acute pancreatitis is a disease in which the pancreas becomes suddenly inflamed. The pancreas is a large gland located behind your stomach. The pancreas produces enzymes that help digest food. The pancreas also releases the hormones glucagon and insulin that help regulate blood sugar. Damage to the pancreas occurs when the digestive enzymes from the pancreas are activated and begin attacking the pancreas before being released into the intestine. Most acute attacks last a couple of days and can cause serious complications. Some people become dehydrated and develop low blood pressure. In severe cases, bleeding into the pancreas can lead to shock and can be life-threatening. The lungs, heart, and kidneys may fail. CAUSES  Pancreatitis can happen to anyone. In some cases, the cause is unknown. Most cases are caused by:  Alcohol abuse.  Gallstones. Other less common causes are:  Certain medicines.  Exposure to certain chemicals.  Infection.  Damage caused by an accident (trauma).  Abdominal surgery. SYMPTOMS   Pain in the upper abdomen that may radiate to the back.  Tenderness and swelling of the abdomen.  Nausea and vomiting. DIAGNOSIS  Your caregiver will perform a physical exam. Blood and stool tests may be done to confirm the diagnosis. Imaging tests may also be done, such as X-rays, CT scans, or an ultrasound of the abdomen. TREATMENT  Treatment usually requires a stay in the hospital. Treatment may include:  Pain medicine.  Fluid replacement through an intravenous line (IV).  Placing a tube in the stomach to remove stomach contents and control vomiting.  Not eating for 3 or 4 days. This gives your pancreas a rest, because enzymes are not being produced that can cause further damage.  Antibiotic medicines if your condition is caused by  an infection.  Surgery of the pancreas or gallbladder. HOME CARE INSTRUCTIONS   Follow the diet advised by your caregiver. This may involve avoiding alcohol and decreasing the amount of fat in your diet.  Eat smaller, more frequent meals. This reduces the amount of digestive juices the pancreas produces.  Drink enough fluids to keep your urine clear or pale yellow.  Only take over-the-counter or prescription medicines as directed by your caregiver.  Avoid drinking alcohol if it caused your condition.  Do not smoke.  Get plenty of rest.  Check your blood sugar at home as directed by your caregiver.  Keep all follow-up appointments as directed by your caregiver. SEEK MEDICAL CARE IF:   You do not recover as quickly as expected.  You develop new or worsening symptoms.  You have persistent pain, weakness, or nausea.  You recover and then have another episode of pain. SEEK IMMEDIATE MEDICAL CARE IF:   You are unable to eat or keep fluids down.  Your pain becomes severe.  You have a fever or persistent symptoms for more than 2 to 3 days.  You have a fever and your symptoms suddenly get worse.  Your skin or the white part of your eyes turn yellow (jaundice).  You develop vomiting.  You feel dizzy, or you faint.  Your blood sugar is high (over 300 mg/dL). MAKE SURE YOU:   Understand these instructions.  Will watch your condition.  Will get help right away if you are not doing well or get worse. Document Released: 07/08/2005 Document Revised: 01/07/2012 Document Reviewed: 10/17/2011 ExitCare Patient Information 2014  [employer].

## 2013-09-30 ENCOUNTER — Encounter: Payer: Self-pay | Admitting: Family Medicine

## 2013-09-30 LAB — LIPID PANEL
Cholesterol: 234 mg/dL — ABNORMAL HIGH (ref 0–200)
HDL: 63 mg/dL (ref >39)
LDL Cholesterol: 122 mg/dL — ABNORMAL HIGH (ref 0–99)
Total CHOL/HDL Ratio: 3.7 Ratio
Triglycerides: 247 mg/dL — ABNORMAL HIGH (ref <150)
VLDL: 49 mg/dL — ABNORMAL HIGH (ref 0–40)

## 2013-09-30 LAB — H. PYLORI ANTIBODY, IGG: H Pylori IgG: <0.4 {ISR}

## 2013-11-01 NOTE — Progress Notes (Signed)
This encounter was created in error - please disregard.

## 2014-01-10 ENCOUNTER — Ambulatory Visit (INDEPENDENT_AMBULATORY_CARE_PROVIDER_SITE_OTHER): Payer: BC Managed Care – PPO | Admitting: Family Medicine

## 2014-01-10 ENCOUNTER — Encounter: Payer: Self-pay | Admitting: Family Medicine

## 2014-01-10 VITALS — BP 123/85 | HR 75 | Resp 16 | Ht 65.0 in | Wt 194.0 lb

## 2014-01-10 DIAGNOSIS — Z733 Stress, not elsewhere classified: Secondary | ICD-10-CM

## 2014-01-10 DIAGNOSIS — K3189 Other diseases of stomach and duodenum: Secondary | ICD-10-CM

## 2014-01-10 DIAGNOSIS — R1013 Epigastric pain: Secondary | ICD-10-CM

## 2014-01-10 DIAGNOSIS — F439 Reaction to severe stress, unspecified: Secondary | ICD-10-CM

## 2014-01-10 DIAGNOSIS — R197 Diarrhea, unspecified: Secondary | ICD-10-CM

## 2014-01-10 DIAGNOSIS — R109 Unspecified abdominal pain: Secondary | ICD-10-CM

## 2014-01-10 MED ORDER — CHOLESTYRAMINE 4 G PO PACK: 4.0000 g | PACK | Freq: Three times a day (TID) | ORAL | Status: AC

## 2014-01-10 NOTE — Progress Notes (Signed)
Subjective:    Patient ID: Melissa Everett, female    DOB: 08/23/1960, 53 y.o.   MRN: [id number]  HPI  Melissa Everett is here today to discuss her chronic stomach issues. She says that she feels that the stress of her job is causing her to have chronic diarrhea.  She says that she is wanting to file for disability. She has been having stomach problems for about 15 years but recently her stomach pains have been worse.     Review of Systems  Constitutional: Negative for activity change, appetite change and fatigue.  Gastrointestinal: Positive for abdominal pain and diarrhea. Negative for constipation and blood in stool.  Psychiatric/Behavioral: The patient is nervous/anxious.   All other systems reviewed and are negative.    Past Medical History  Diagnosis Date  . Hypertension   . Diverticulosis   . Colitis   . Clostridium difficile infection      Past Surgical History  Procedure Laterality Date  . Abdominal hysterectomy       History   Social History Narrative   Marital Status: Widowed   Children:  Son Development worker, international aid(Dean)    Pets: Cats (2)    Living Situation: Lives alone.    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 Hyperlipidemia Mother   . Diabetes Mother   . Hypertension Mother   . Colitis Brother   . Colitis Paternal Aunt   . Diabetes Maternal Grandmother   . Heart attack Maternal Grandfather   . Cancer Paternal Grandmother     Lung   . Heart attack Paternal Grandfather   . ALS Father   . Thyroid disease Sister      Current Outpatient Prescriptions on File Prior to Visit  Medication Sig Dispense Refill  . dicyclomine (BENTYL) 20 MG tablet Take 1 tablet (20 mg total) by mouth 3 (three) times daily before meals.  60  tablet  1  . propranolol (INDERAL) 20 MG tablet Take 1 tablet (20 mg total) by mouth 2 (two) times daily.  180 tablet  3   No current facility-administered medications on file prior to visit.     No Known Allergies     Objective:   Physical Exam  Vitals reviewed. Constitutional: She is oriented to person, place, and time. She appears well-nourished.  Eyes: Pupils are equal, round, and reactive to light.  Neck: Normal range of motion.  Cardiovascular: Normal rate.   Musculoskeletal: Normal range of motion.  Neurological: She is alert and oriented to person, place, and time.  Skin: Skin is warm and dry.  Psychiatric: Her mood appears anxious. She exhibits a depressed mood.      Assessment & Plan:    Melissa Everett was seen today for diarrhea.  Diagnoses and associated orders for this visit:  Diarrhea Comments: We discussed many things that Melissa Everett can try for her diarrhea.  [facility]  She did not keep appointment.  She is to add some Questran.   - cholestyramine (QUESTRAN) 4 G packet; Take 1 packet (4 g total) by mouth 3 (three) times daily with  meals.  Stomach pain Comments: It appears that she has stopped Protonix.    Situational stress Comments: Melissa Everett should consider getting some help for her stress.  She may need to find a new job.  This would be a better choice.  I do not feel that she is disabled.

## 2014-01-10 NOTE — Patient Instructions (Signed)
1)  Chronic Diarrhea  Consider doing a dairy free diet vs gluten free Questran Powder 1-3 times per day Calcium Supplement (Tums) Iron (65 mg)    Chronic Diarrhea Diarrhea is frequent loose and watery bowel movements. It can cause you to feel weak and dehydrated. Dehydration can cause you to become tired and thirsty and to have a dry mouth, decreased urination, and dark yellow urine. Diarrhea is a sign of another problem, most often an infection that will not last long. In most cases, diarrhea lasts 2-3 days. Diarrhea that lasts longer than 4 weeks is called long-lasting (chronic) diarrhea. It is important to treat your diarrhea as directed by your health care provider to lessen or prevent future episodes of diarrhea.  CAUSES  There are many causes of chronic diarrhea. The following are some possible causes:   Gastrointestinal infections caused by viruses, bacteria, or parasites.   Food poisoning or food allergies.   Certain medicines, such as antibiotics, chemotherapy, and laxatives.   Artificial sweeteners and fructose.   Digestive disorders, such as celiac disease and inflammatory bowel diseases.   Irritable bowel syndrome.  Some disorders of the pancreas.  Disorders of the thyroid.  Reduced blood flow to the intestines.  Cancer. Sometimes the cause of chronic diarrhea is unknown. RISK FACTORS  Having a severely weakened immune system, such as from HIV or AIDS.   Taking certain types of cancer-fighting drugs (such as with chemotherapy) or other medicines.   Having had a recent organ transplant.   Having a portion of the stomach or small bowel removed.   Traveling to countries where food and water supplies are often contaminated.  SYMPTOMS  In addition to frequent, loose stools, diarrhea may cause:   Cramping.   Abdominal pain.   Nausea.   Fever.  Fatigue.  Urgent need to use the bathroom.  Loss of bowel control. DIAGNOSIS  Your health care  provider must take a careful history and perform a physical exam. Tests given are based on your symptoms and history. Tests may include:   Blood or stool tests. Three or more stool samples may be examined. Stool cultures may be used to test for bacteria or parasites.   X-rays.   A procedure in which a thin tube is inserted into the mouth or rectum (endoscopy). This allows the health care provider to look inside the intestine.  TREATMENT   Treatment is aimed at correcting the cause of the diarrhea when possible.  Diarrhea caused by an infection can often be treated with antibiotic medicines.  Diarrhea not caused by an infection may require you to take long-term medicine or have surgery. Specific treatment should be discussed with your health care provider.  If the cause cannot be determined, treatment aims to relieve symptoms and prevent dehydration. Serious health problems can occur if you do not maintain proper fluid levels. Treatment may include:  Taking an oral rehydration solution (ORS).  Not drinking beverages that contain caffeine (such as tea, coffee, and soft drinks).  Not drinking alcohol.  Maintaining well-balanced nutrition to help you recover faster. HOME CARE INSTRUCTIONS   Drink enough fluids to keep urine clear or pale yellow. Drink 1 cup (8 oz) of fluid for each diarrhea episode. Avoid fluids that contain simple sugars, fruit juices, whole milk products, and sodas. Hydrate with an ORS. You may purchase the ORS or prepare it at home by mixing the following ingredients together:   - tsp (1.7-3  mL) table salt.   tsp (3  mL) baking soda.   tsp (1.7 mL) salt substitute containing potassium chloride.  1 tbsp (20 mL) sugar.  4.2 c (1 L) of water.   Certain foods and beverages may increase the speed at which food moves through the gastrointestinal (GI) tract. These foods and beverages should be avoided. They include:  Caffeinated and alcoholic  beverages.  High-fiber foods, such as raw fruits and vegetables, nuts, seeds, and whole grain breads and cereals.  Foods and beverages sweetened with sugar alcohols, such as xylitol, sorbitol, and mannitol.   Some foods may be well tolerated and may help thicken stool. These include:  Starchy foods, such as rice, toast, pasta, low-sugar cereal, oatmeal, grits, baked potatoes, crackers, and bagels.  Bananas.  Applesauce.  Add probiotic-rich foods to help increase healthy bacteria in the GI tract. These include yogurt and fermented milk products.  Wash your hands well after each diarrhea episode.  Only take over-the-counter or prescription medicines as directed by your health care provider.  Take a warm bath to relieve any burning or pain from frequent diarrhea episodes. SEEK MEDICAL CARE IF:   You are not urinating as often.  Your urine is a dark color.  You become very tired or dizzy.  You have severe pain in the abdomen or rectum.  Your have blood or pus in your stools.  Your stools look black and tarry. SEEK IMMEDIATE MEDICAL CARE IF:   You are unable to keep fluids down.  You have persistent vomiting.  You have blood in your stool.  Your stools are black and tarry.  You do not urinate in 6-8 hours, or there is only a small amount of very dark urine.  You have abdominal pain that increases or localizes.  You have weakness, dizziness, confusion, or lightheadedness.  You have a severe headache.  Your diarrhea gets worse or does not get better.  You have a fever or persistent symptoms for more than 2-3 days.  You have a fever and your symptoms suddenly get worse. MAKE SURE YOU:   Understand these instructions.  Will watch your condition.  Will get help right away if you are not doing well or get worse. Document Released: 09/28/2003 Document Revised: 07/13/2013 Document Reviewed: 12/31/2012 [facility] Patient Information 2015 [place], [place]. This  information is not intended to replace advice given to you by your health care provider. Make sure you discuss any questions you have with your health care provider.

## 2014-02-26 ENCOUNTER — Emergency Department (HOSPITAL_BASED_OUTPATIENT_CLINIC_OR_DEPARTMENT_OTHER)
Admission: EM | Admit: 2014-02-26 | Discharge: 2014-02-26 | Disposition: A | Discharge status: Home / Self Care | Payer: BC Managed Care – PPO | Attending: Emergency Medicine | Admitting: Emergency Medicine

## 2014-02-26 ENCOUNTER — Encounter (HOSPITAL_BASED_OUTPATIENT_CLINIC_OR_DEPARTMENT_OTHER): Payer: Self-pay | Admitting: Emergency Medicine

## 2014-02-26 ENCOUNTER — Emergency Department (HOSPITAL_BASED_OUTPATIENT_CLINIC_OR_DEPARTMENT_OTHER): Payer: BC Managed Care – PPO

## 2014-02-26 DIAGNOSIS — I10 Essential (primary) hypertension: Secondary | ICD-10-CM | POA: Diagnosis not present

## 2014-02-26 DIAGNOSIS — R059 Cough, unspecified: Secondary | ICD-10-CM | POA: Insufficient documentation

## 2014-02-26 DIAGNOSIS — Z8619 Personal history of other infectious and parasitic diseases: Secondary | ICD-10-CM | POA: Diagnosis not present

## 2014-02-26 DIAGNOSIS — J069 Acute upper respiratory infection, unspecified: Secondary | ICD-10-CM | POA: Insufficient documentation

## 2014-02-26 DIAGNOSIS — Z79899 Other long term (current) drug therapy: Secondary | ICD-10-CM | POA: Insufficient documentation

## 2014-02-26 DIAGNOSIS — R05 Cough: Secondary | ICD-10-CM | POA: Diagnosis present

## 2014-02-26 DIAGNOSIS — K5289 Other specified noninfective gastroenteritis and colitis: Secondary | ICD-10-CM | POA: Insufficient documentation

## 2014-02-26 NOTE — Discharge Instructions (Signed)
Cough, Adult   A cough is a reflex. It helps you clear your throat and airways. A cough can help heal your body. A cough can last 2 or 3 weeks (acute) or may last more than 8 weeks (chronic). Some common causes of a cough can include an infection, allergy, or a cold.  HOME CARE  · Only take medicine as told by your doctor.  · If given, take your medicines (antibiotics) as told. Finish them even if you start to feel better.  · Use a cold steam vaporizer or humidifier in your home. This can help loosen thick spit (secretions).  · Sleep so you are almost sitting up (semi-upright). Use pillows to do this. This helps reduce coughing.  · Rest as needed.  · Stop smoking if you smoke.  GET HELP RIGHT AWAY IF:  · You have yellowish-white fluid (pus) in your thick spit.  · Your cough gets worse.  · Your medicine does not reduce coughing, and you are losing sleep.  · You cough up blood.  · You have trouble breathing.  · Your pain gets worse and medicine does not help.  · You have a fever.  MAKE SURE YOU:   · Understand these instructions.  · Will watch your condition.  · Will get help right away if you are not doing well or get worse.  Document Released: 03/21/2011 Document Revised: 11/22/2013 Document Reviewed: 03/21/2011  ExitCare® Patient Information ©2015 [employer]. This information is not intended to replace advice given to you by your health care provider. Make sure you discuss any questions you have with your health care provider.

## 2014-02-26 NOTE — ED Provider Notes (Signed)
CSN: [id number]     Arrival date & time 02/26/14  1112 History   First MD Initiated Contact with Patient 02/26/14 1204     Chief Complaint  Patient presents with  . Cough      Patient is a 53 y.o. female presenting with cough. The history is provided by the patient.  Cough Severity:  Moderate Onset quality:  Gradual Duration:  1 week Timing:  Intermittent Progression:  Worsening Chronicity:  New Smoker: no   Relieved by:  None tried Worsened by:  Nothing tried Associated symptoms: fever and sore throat   Associated symptoms: no shortness of breath   Pt presents for cough up to 1 week and fever/chills for past 2 days No SOB She is not a smoker  No hemoptysis is reported  Past Medical History  Diagnosis Date  . Hypertension   . Diverticulosis   . Colitis   . Clostridium difficile infection    Past Surgical History  Procedure Laterality Date  . Abdominal hysterectomy     Family History  Problem Relation Age of Onset  . Hyperlipidemia Mother   . Diabetes Mother   . Hypertension Mother   . Colitis Brother   . Colitis Paternal Aunt   . Diabetes Maternal Grandmother   . Heart attack Maternal Grandfather   . Cancer Paternal Grandmother     Lung   . Heart attack Paternal Grandfather   . ALS Father   . Thyroid disease Sister    History  Substance Use Topics  . Smoking status: Never Smoker   . Smokeless tobacco: Never Used  . Alcohol Use: 12.6 oz/week    21 Shots of liquor per week     Comment: She drinks 2-3 shots of Vodka with club soda every night    OB History   Grav Para Term Preterm Abortions TAB SAB Ect Mult Living                 Review of Systems  Constitutional: Positive for fever.  HENT: Positive for sore throat.   Respiratory: Positive for cough. Negative for shortness of breath.   Gastrointestinal: Negative for vomiting.  All other systems reviewed and are negative.     Allergies  Review of patient's allergies indicates no known  allergies.  Home Medications   Prior to Admission medications   Medication Sig Start Date End Date Taking? Authorizing Provider  cholestyramine (QUESTRAN) 4 G packet Take 1 packet (4 g total) by mouth 3 (three) times daily with meals. 01/10/14 01/11/15  Gillian Scarceobyn K Zanard, MD  dicyclomine (BENTYL) 20 MG tablet Take 1 tablet (20 mg total) by mouth 3 (three) times daily before meals. 08/10/13 08/10/14  Gillian Scarceobyn K Zanard, MD  propranolol (INDERAL) 20 MG tablet Take 1 tablet (20 mg total) by mouth 2 (two) times daily. 09/29/13 09/30/14  Gillian Scarceobyn K Zanard, MD   BP 121/78  Pulse 74  Temp(Src) 98.8 F (37.1 C) (Oral)  Resp 17  Ht 5\' 5"  (1.651 m)  Wt 194 lb (87.998 kg)  BMI 32.28 kg/m2  SpO2 100% Physical Exam CONSTITUTIONAL: Well developed/well nourished HEAD: Normocephalic/atraumatic EYES: EOMI/PERRL ENMT: Mucous membranes moist, uvula midline, normal phonation, no stridor, erythema to pharynx but no exudates NECK: supple no meningeal signs SPINE:entire spine nontender CV: S1/S2 noted, no murmurs/rubs/gallops noted LUNGS: Lungs are clear to auscultation bilaterally, no apparent distress ABDOMEN: soft, nontender, no rebound or guarding GU:no cva tenderness NEURO: Pt is awake/alert, moves all extremitiesx4 EXTREMITIES: pulses normal, full ROM SKIN:  warm, color normal PSYCH: no abnormalities of mood noted  ED Course  Procedures  Suspect viral infection, pt well appearing, no distress, reports fever/chills but dyspnea reported Stable for d/c home Imaging Review Dg Chest 2 View  02/26/2014   CLINICAL DATA:  Cough, body aches and fever.  EXAM: CHEST - 2 VIEW  COMPARISON:  Abdominal series on 06/26/2013  FINDINGS: The heart size and mediastinal contours are within normal limits. There is no evidence of pulmonary edema, consolidation, pneumothorax, nodule or pleural fluid. The visualized skeletal structures are unremarkable.  IMPRESSION: No active disease.   Electronically Signed   By: Irish Lack M.D.    On: 02/26/2014 11:58    MDM   Final diagnoses:  Viral URI    Nursing notes including past medical history and social history reviewed and considered in documentation xrays reviewed and considered     Joya Gaskins, MD 02/26/14 1547

## 2014-02-26 NOTE — ED Notes (Signed)
PT discharged to home NAD.  

## 2014-02-26 NOTE — ED Notes (Signed)
Patient here with dry cough since first of week and now aching and fever x 2 days.  Taking tylenol with no relief, reports general aching and fatigue with same

## 2014-03-09 DIAGNOSIS — R197 Diarrhea, unspecified: Secondary | ICD-10-CM | POA: Insufficient documentation

## 2014-03-22 ENCOUNTER — Encounter: Payer: Self-pay | Admitting: Family Medicine

## 2014-09-27 IMAGING — CR DG CHEST 2V
2 series · 2 of 2 positions shown · non-contrast
Comparison: Abdominal series on 06/26/2013

CLINICAL DATA: Cough, body aches and fever.

EXAM:
CHEST - 2 VIEW

[w chest pa]
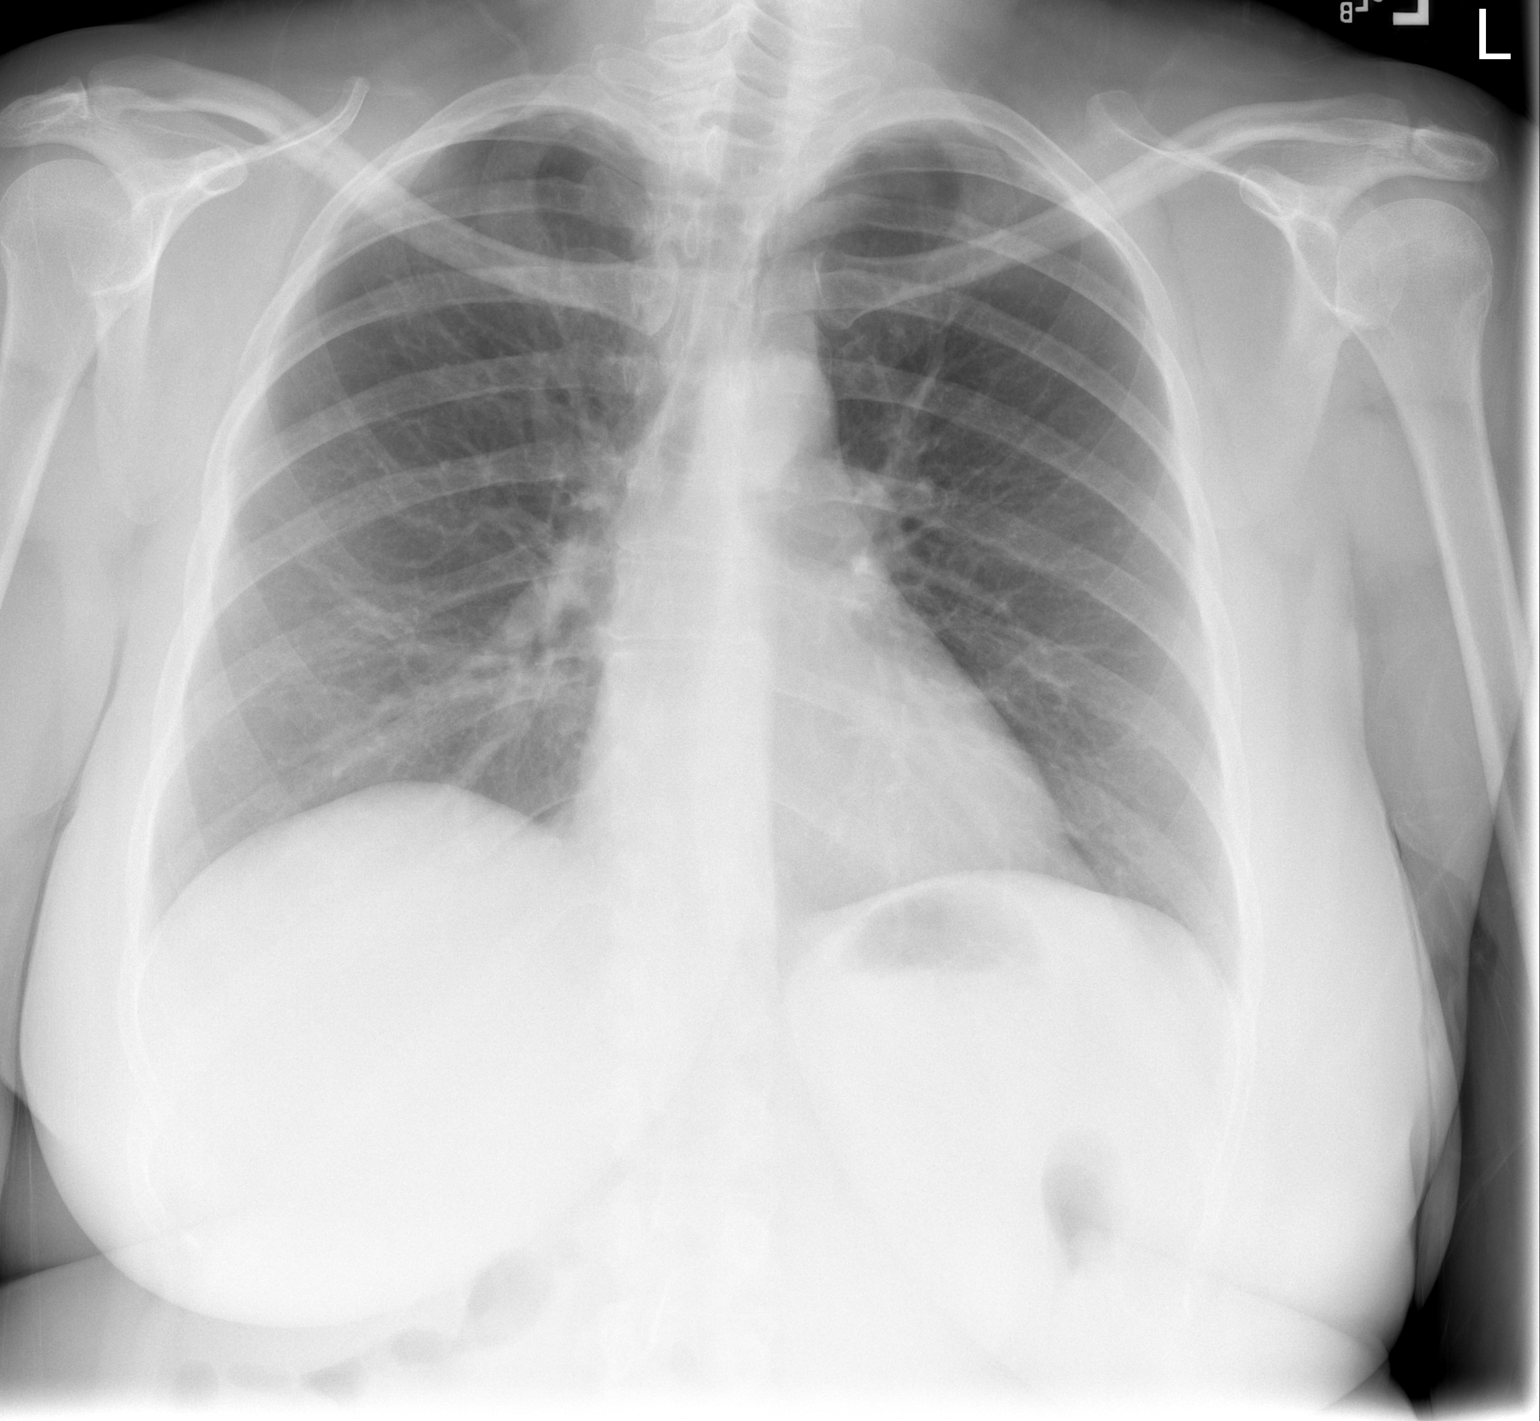

[w chest lat]
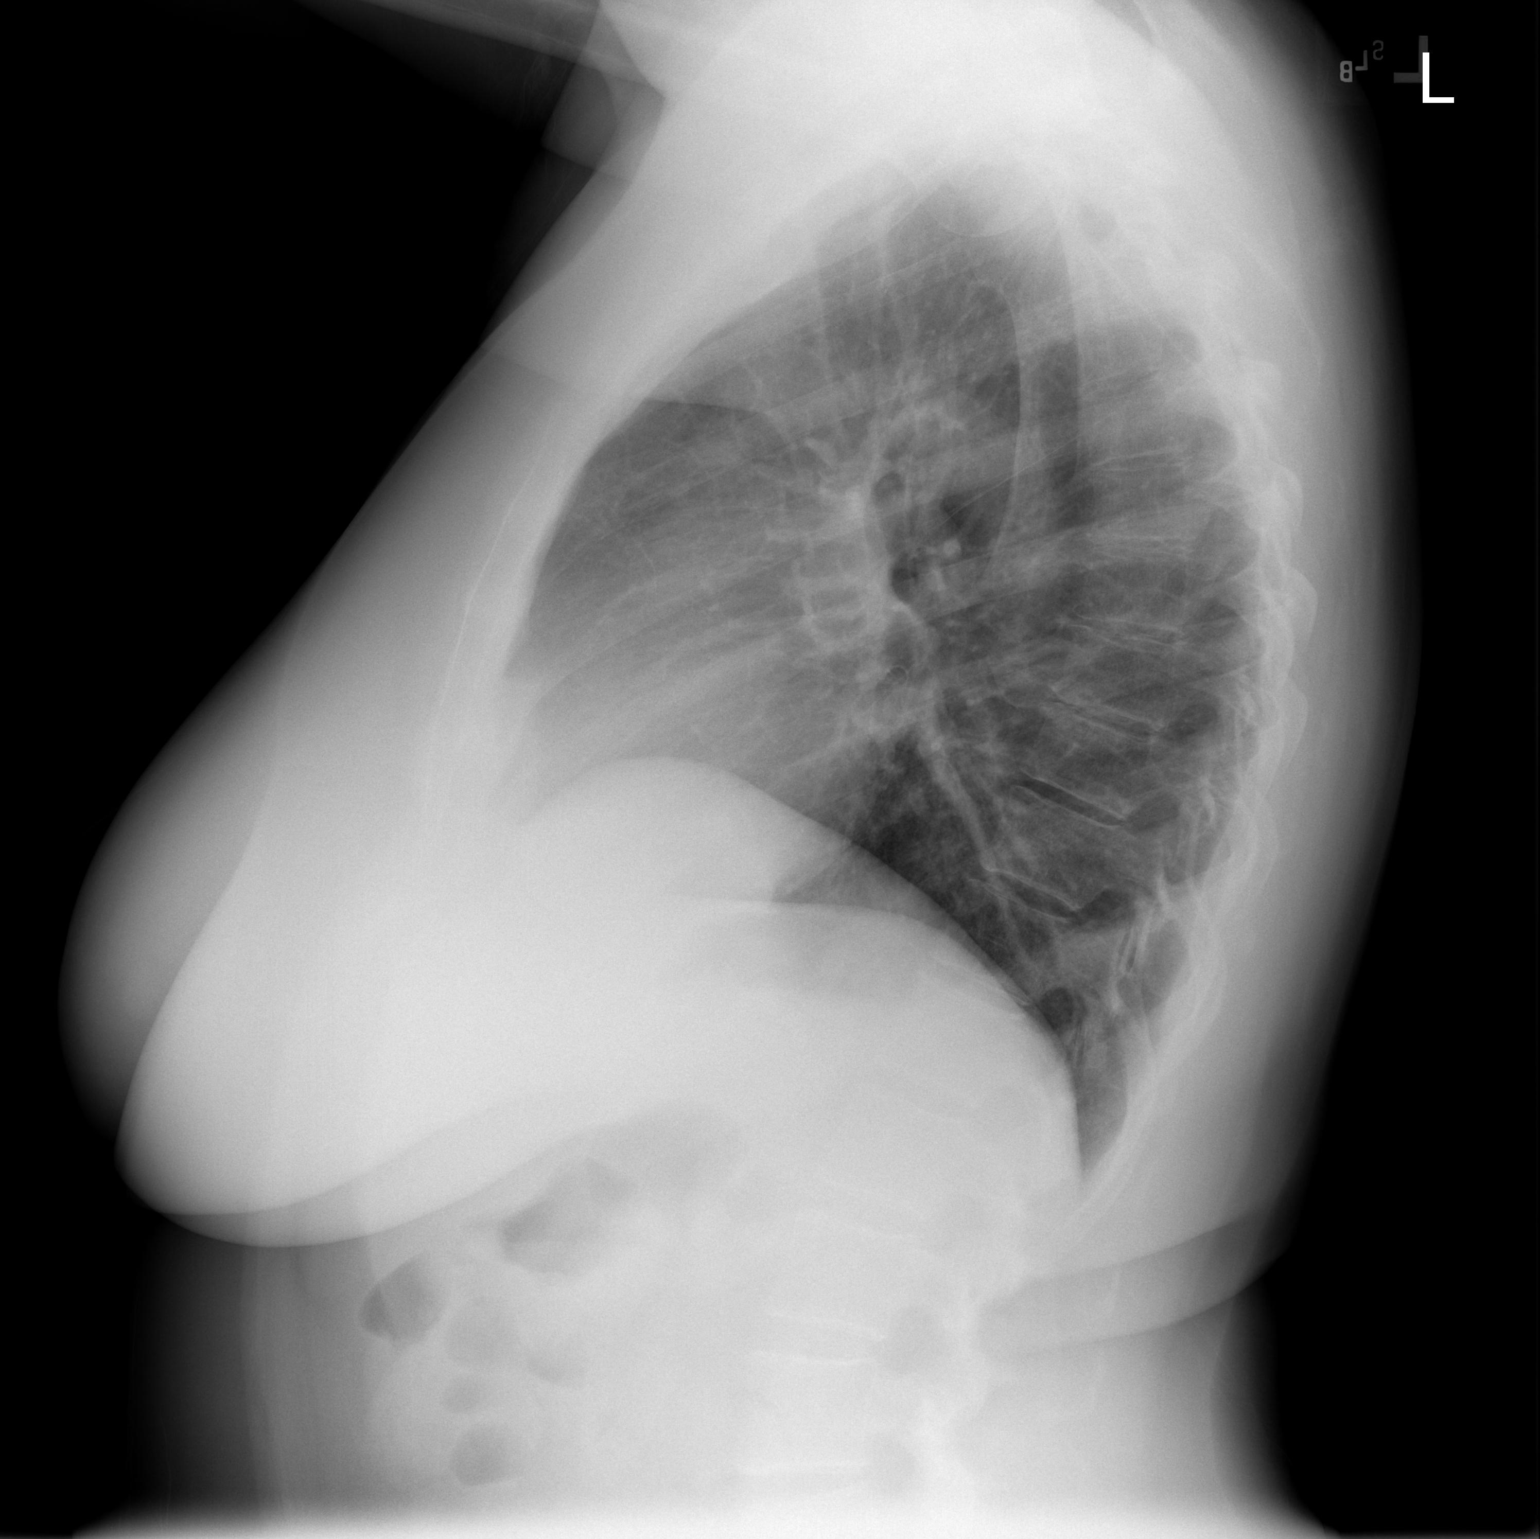

[2 of 2 positions shown; findings below may reference images not displayed]

FINDINGS: The heart size and mediastinal contours are within normal limits.
There is no evidence of pulmonary edema, consolidation,
pneumothorax, nodule or pleural fluid. The visualized skeletal
structures are unremarkable.
IMPRESSION: No active disease.
